# Patient Record
Sex: Male | Born: 1990 | Race: White | Hispanic: No | Marital: Single | Smoking: Never smoker
Health system: Southern US, Community
[De-identification: ages and names within clinical notes are randomized; demographics above are authoritative.]

## PROBLEM LIST (undated history)

## (undated) DIAGNOSIS — R079 Chest pain, unspecified: Secondary | ICD-10-CM

## (undated) HISTORY — PX: COLONOSCOPY: SHX174

## (undated) HISTORY — PX: CIRCUMCISION: SHX1350

## (undated) HISTORY — DX: Chest pain, unspecified: R07.9

---
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
## 2011-11-29 ENCOUNTER — Inpatient Hospital Stay: Payer: Self-pay | Admitting: Psychiatry

## 2011-11-29 LAB — COMPREHENSIVE METABOLIC PANEL
Albumin: 4.6 g/dL (ref 3.8–5.6)
Alkaline Phosphatase: 109 U/L (ref 98–317)
BUN: 12 mg/dL (ref 7–18)
Bilirubin,Total: 0.3 mg/dL (ref 0.2–1.0)
Calcium, Total: 8.7 mg/dL — ABNORMAL LOW (ref 9.0–10.7)
Co2: 19 mmol/L — ABNORMAL LOW (ref 21–32)
Creatinine: 1.09 mg/dL (ref 0.60–1.30)
EGFR (African American): >60
Osmolality: 287 (ref 275–301)
Potassium: 2.4 mmol/L — CL (ref 3.5–5.1)
SGOT(AST): 24 U/L (ref 10–41)
SGPT (ALT): 33 U/L
Total Protein: 8.1 g/dL (ref 6.4–8.6)

## 2011-11-29 LAB — URINALYSIS, COMPLETE
Bacteria: NONE SEEN
Blood: NEGATIVE
Leukocyte Esterase: NEGATIVE
RBC,UR: NONE SEEN /HPF (ref 0–5)
WBC UR: 2 /HPF (ref 0–5)

## 2011-11-29 LAB — CBC
HCT: 43.2 % (ref 40.0–52.0)
HGB: 14.5 g/dL (ref 13.0–18.0)
MCHC: 33.5 g/dL (ref 32.0–36.0)
MCV: 88 fL (ref 80–100)
Platelet: 204 10*3/uL (ref 150–440)
RBC: 4.91 10*6/uL (ref 4.40–5.90)
RDW: 12.8 % (ref 11.5–14.5)
WBC: 14.7 10*3/uL — ABNORMAL HIGH (ref 3.8–10.6)

## 2011-11-29 LAB — DRUG SCREEN, URINE
Cannabinoid 50 Ng, Ur ~~LOC~~: NEGATIVE (ref ?–50)
Cocaine Metabolite,Ur ~~LOC~~: NEGATIVE (ref ?–300)
Methadone, Ur Screen: NEGATIVE (ref ?–300)
Phencyclidine (PCP) Ur S: NEGATIVE (ref ?–25)
Tricyclic, Ur Screen: NEGATIVE (ref ?–1000)

## 2011-11-29 LAB — ETHANOL: Ethanol %: <0.003 % (ref 0.000–0.080)

## 2011-11-29 LAB — TSH: Thyroid Stimulating Horm: 1.65 u[IU]/mL

## 2011-11-30 LAB — BEHAVIORAL MEDICINE 1 PANEL
Alkaline Phosphatase: 82 U/L — ABNORMAL LOW (ref 98–317)
Anion Gap: 9 (ref 7–16)
BUN: 8 mg/dL (ref 7–18)
Basophil #: 0 10*3/uL (ref 0.0–0.1)
Basophil %: 0.3 %
Bilirubin,Total: 0.4 mg/dL (ref 0.2–1.0)
Chloride: 106 mmol/L (ref 98–107)
Co2: 26 mmol/L (ref 21–32)
Creatinine: 0.94 mg/dL (ref 0.60–1.30)
Eosinophil #: 0.1 10*3/uL (ref 0.0–0.7)
Glucose: 83 mg/dL (ref 65–99)
HCT: 41.2 % (ref 40.0–52.0)
HGB: 13.6 g/dL (ref 13.0–18.0)
Lymphocyte #: 3.2 10*3/uL (ref 1.0–3.6)
MCHC: 33 g/dL (ref 32.0–36.0)
Monocyte %: 6.1 %
Neutrophil #: 5 10*3/uL (ref 1.4–6.5)
Neutrophil %: 56.6 %
Osmolality: 279 (ref 275–301)
Platelet: 209 10*3/uL (ref 150–440)
Potassium: 3.7 mmol/L (ref 3.5–5.1)
RDW: 13.7 % (ref 11.5–14.5)
SGOT(AST): 16 U/L (ref 10–41)
SGPT (ALT): 30 U/L
Sodium: 141 mmol/L (ref 136–145)
Thyroid Stimulating Horm: 1.3 u[IU]/mL
Total Protein: 7.1 g/dL (ref 6.4–8.6)
WBC: 8.8 10*3/uL (ref 3.8–10.6)

## 2014-12-27 NOTE — Discharge Summary (Signed)
PATIENT NAME:  Cole Chavez, Cole G MR#:  [id number] DATE OF BIRTH:  1991/05/03  DATE OF ADMISSION:  11/29/2011 DATE OF DISCHARGE:  12/02/2011  HISTORY OF PRESENT ILLNESS: Cole Chavez was admitted to the inpatient behavioral unit after presenting status post overdose on trazodone and NyQuil. He had obtained the trazodone from a neighbor. His acute stress was a break-up with a girlfriend. He had some grief regarding his grandfather passing away a year prior to admission. He was not showing any auditory hallucinations or delusions. He was not disoriented. His memory function was intact. He also is socially appropriate.   LABORATORY, DIAGNOSTIC AND RADIOLOGICAL DATA: None.   HOSPITAL COURSE: Cole Chavez was admitted to the inpatient behavioral health unit and underwent milieu and group psychotherapy. He progressed well and immediately began to express regret about taking the overdose. He expressed constructive future goals and interests. He consistently shared that he was resolved about the break-up with his girlfriend.   CONDITION ON DISCHARGE: By 12/02/2011, Cole Chavez is alert. His energy is normal. His concentration is normal. His interests are also normal. He has constructive future goals. He has no thoughts of harming himself or others. He is behaving well in the milieu with normal social activity.   MENTAL STATUS EXAM UPON DISCHARGE: Cole Chavez is alert. Eye contact good. Concentration normal. Orientation to all spheres intact. Memory intact to immediate, recent, and remote. Fund of knowledge, intelligence, and use of language normal. Speech involves normal rate and prosody without dysarthria. Thought process logical, coherent, and goal directed. No looseness of associations. Thought content: No thoughts of harming himself. No thoughts of harming others. No delusions or hallucinations. Affect broad and appropriate. Mood within normal limits. Insight normal. Judgment intact.   Cole Chavez is  not at risk to harm himself or others. He agrees to call emergency services immediately for any thoughts of harming himself, thoughts of harming others, or distress.   DISCHARGE DIAGNOSES:  AXIS I: Adjustment disorder with disturbance of emotions and conduct, resolved.   AXIS II: Deferred.   AXIS III: Status post trazodone and NyQuil overdose.   AXIS IV: Primary support group.   AXIS V: 55.   DIET: Regular.   ACTIVITY: Routine.   FOLLOW UP: [facility] 12:30 p.m. for enhancement of coping skills and stress management.  ____________________________ Adelene AmasJames S. Neeko Pharo, MD jsw:cms D: 12/03/2011 11:08:29 ET T: 12/04/2011 13:24:10 ET JOB#: [id number]  cc: Adelene AmasJames S. Dontee Jaso, MD, <Dictator> Lester CarolinaJAMES S Remmi Armenteros MD ELECTRONICALLY SIGNED 12/04/2011 81:1920:02

## 2014-12-27 NOTE — H&P (Signed)
PATIENT NAME:  Cole Chavez, [name]:  [id number] DATE OF BIRTH:  Jan 08, 1991  DATE OF ADMISSION:  11/29/2011  CHIEF COMPLAINT AND IDENTIFYING DATA: Mr. Cole Chavez Predmore is a 24 year old male presenting to the Emergency Department on 11/29/2011 stating that he was overwhelmed after his girlfriend had broken up with him. He took an overdose of trazodone as well as NyQuil.   HISTORY OF PRESENT ILLNESS: Mr. Cole Chavez is stating that the trazodone taken was not his. He does not know of any past history of psychotropic medication.   He did have some appropriate regret about taking the overdose. He cited stresses with a girlfriend breakup as well as working and going to school at the same time. He also has been grieving his grandfather passing away a year ago.   He has not been having any auditory hallucinations or other hallucinations. He has no delusions. He is oriented to all spheres. He is socially appropriate. He only felt overwhelmed for one day.   PAST PSYCHIATRIC HISTORY: He denies any history of periods of elevated mood, racing thoughts, decreased need for energy, or increased goal-directed activity. He denies any history of periods of depressed mood, poor concentration, anhedonia. He has no history of hallucinations or delusions. He denies any history of treatment with psychotropic medication.   FAMILY PSYCHIATRIC HISTORY: None known.   SOCIAL HISTORY: Please see the above. Mr. Cole Chavez denies any alcohol or illegal drug use. He lives with a friend. He states that he lives in a supportive environment. He has no history of being arrested or incarcerated.   ALLERGIES: No known drug allergies.   PAST MEDICAL HISTORY: 1. Status post overdose with trazodone and NyQuil.  2. Usual childhood illnesses.   LABORATORY, DIAGNOSTIC AND RADIOLOGICAL DATA: SGPT and SGOT normal. TSH normal. Albumin, total protein normal. BUN, creatinine normal. CBC unremarkable. Urinalysis unremarkable. Tylenol negative.  Urine drug screen positive for amphetamines and MDMA. TSH normal. Salicylates negative. Ethanol negative.   REVIEW OF SYSTEMS: Constitutional, HEENT, mouth, neurologic, psychiatric, cardiovascular, respiratory, gastrointestinal, genitourinary, skin, musculoskeletal, hematologic, lymphatic, endocrine, metabolic all unremarkable.   PHYSICAL EXAMINATION:  VITAL SIGNS: Temperature 98, pulse 98, respiratory rate 18, blood pressure 140/72.   GENERAL APPEARANCE: Mr. Cole Chavez Kneip is a young man looking his chronologic age well developed, well nourished lying in a supine position in his hospital bed with no abnormal involuntary movements. He has no cachexia. His muscle tone is normal. He is groomed well. His hygiene is normal.   HEENT: Normocephalic, atraumatic. Pupils equally round, reactive to light and accommodation. Oropharynx clear without erythema.   SKIN: Normal turgor. No rashes.   EXTREMITIES: No cyanosis, clubbing, or edema.   RESPIRATORY: Clear to auscultation. No wheezing, rhonchi, or crackles.   CARDIOVASCULAR: Regular rate and rhythm. No murmurs, rubs or gallops.   ABDOMEN: Bowel sounds positive, nontender.   GENITOURINARY: Deferred.   NEUROLOGIC: Cranial nerves II through XII intact. Sensory intact throughout to light touch. Motor 5/5 strength throughout. Deep tendon reflexes normal strength and symmetry throughout. No Babinski. Coordination intact bilaterally finger-to-nose.   MENTAL STATUS EXAMINATION: Mr. Cole Chavez is alert. His eye contact is good. His concentration is normal. He is oriented to all spheres. Memory is intact to immediate, recent, and remote. Fund of knowledge, intelligence, and use of language normal. Speech involves normal rate and prosody without dysarthria. Thought process is logical, coherent, and goal directed. No looseness of associations or tangents. Thought content: No thoughts of harming himself or others. No delusions or hallucinations. He does  have  appropriate level of regret about the overdose. Affect is mildly flat. Mood is normal. Insight is intact. Judgment is questionable.   ASSESSMENT:  AXIS I:  1. Adjustment disorder with mixed disturbance of emotions and conduct.  2. Rule out polysubstance abuse.   AXIS II: Deferred.   AXIS III: Status post overdose of trazodone and NyQuil.   AXIS IV: Primary support group, academic, occupational.   AXIS V: 45.   Cole Chavez is recovering from his risk of self harm. He has appropriate regret regarding the activity.   PLAN: Will admit Cole Chavez is to the inpatient behavioral health unit for further evaluation and treatment and in order to confirm that he continues to improve his emotions, behavior, and cognition.   Will monitor for any need for psychotropic medication.   Mr. Schlabach is a candidate for outpatient psychotherapy involving coping skills, improvement as well as ego support. He may require some substance abuse treatment as well if the urine drug screen does not involve errors.  ____________________________ Adelene Amas. Yamel Bale, MD jsw:cms D: 12/01/2011 12:05:20 ET T: 12/01/2011 15:15:31 ET JOB#: [id number]  cc: Adelene Amas. Neeley Sedivy, MD, <Dictator> Lester Towanda MD ELECTRONICALLY SIGNED 12/01/2011 23:20

## 2020-08-08 ENCOUNTER — Emergency Department
Admission: EM | Admit: 2020-08-08 | Discharge: 2020-08-08 | Disposition: A | Discharge status: Home / Self Care | Payer: BC Managed Care – PPO | Attending: Emergency Medicine | Admitting: Emergency Medicine

## 2020-08-08 ENCOUNTER — Other Ambulatory Visit: Payer: Self-pay

## 2020-08-08 ENCOUNTER — Encounter: Payer: Self-pay | Admitting: Emergency Medicine

## 2020-08-08 ENCOUNTER — Emergency Department: Payer: BC Managed Care – PPO

## 2020-08-08 DIAGNOSIS — R070 Pain in throat: Secondary | ICD-10-CM | POA: Insufficient documentation

## 2020-08-08 DIAGNOSIS — J029 Acute pharyngitis, unspecified: Secondary | ICD-10-CM

## 2020-08-08 DIAGNOSIS — Z03821 Encounter for observation for suspected ingested foreign body ruled out: Secondary | ICD-10-CM

## 2020-08-08 DIAGNOSIS — R1010 Upper abdominal pain, unspecified: Secondary | ICD-10-CM | POA: Insufficient documentation

## 2020-08-08 DIAGNOSIS — T199XXA Foreign body in genitourinary tract, part unspecified, initial encounter: Secondary | ICD-10-CM

## 2020-08-08 MED ORDER — LIDOCAINE VISCOUS HCL 2 % MT SOLN
15.0000 mL | Freq: Once | OROMUCOSAL | Status: AC
  Administered 2020-08-08: 15 mL via OROMUCOSAL
  Filled 2020-08-08: qty 15

## 2020-08-08 NOTE — Discharge Instructions (Addendum)
Please return to the emergency department if you have any acute worsening including abdominal pain, bloody stools or any other new symptoms.

## 2020-08-08 NOTE — ED Provider Notes (Signed)
[facility] Emergency Department Provider Note  ____________________________________________   None    (approximate)  I have reviewed the triage vital signs and the nursing notes.   HISTORY  Chief Complaint Foreign Body  HPI Cole Chavez is a 29 y.o. male who reports to the emergency department for evaluation of ingested foreign body.  The patient states that he went to eat some stoop that he had recently gotten out of the microwave when he swallowed what he believes to be 2 nails.  He states that he regurgitated 1 nail up and it was approximately half to 1 inch long.  He believes but is unsure that he may have swallowed a second.  He thinks the way the nail got in the stew was he saw his catch up up on a ledge that was nearby.  On the top without ledge were a few nails as was also a metallic broken Christmas tree ornament that the cat had also broken.  He is unsure if this second item could have been metallic coated glass versus nail.  He states that someone with him perform the Heimlich to try to get up any thing remaining but nothing else came up.  At this time, his primary complaint is sore throat.  He has had some intermittent upper abdominal pain, but states that this was just after the Heimlich maneuver and he does not have any at this time.  He denies any pain in his chest, shortness of breath.         History reviewed. No pertinent past medical history.  There are no problems to display for this patient.   History reviewed. No pertinent surgical history.  Prior to Admission medications   Not on File    Allergies Patient has no known allergies.  No family history on file.  Social History Social History   Tobacco Use  . Smoking status: Never Smoker  . Smokeless tobacco: Never Used  Vaping Use  . Vaping Use: Never used  Substance Use Topics  . Alcohol use: Not on file  . Drug use: Not on file    Review of  Systems Constitutional: No fever/chills Eyes: No visual changes. ENT: + sore throat. Cardiovascular: Denies chest pain. Respiratory: Denies shortness of breath. Gastrointestinal: + abdominal pain.  No nausea, no vomiting.  No diarrhea.  No constipation. Genitourinary: Negative for dysuria. Musculoskeletal: Negative for back pain. Skin: Negative for rash. Neurological: Negative for headaches, focal weakness or numbness.   ____________________________________________   PHYSICAL EXAM:  VITAL SIGNS: ED Triage Vitals  Enc Vitals Group     BP 08/08/20 1420 124/77     Pulse Rate 08/08/20 1420 75     Resp 08/08/20 1420 18     Temp 08/08/20 1420 97.6 F (36.4 C)     Temp Source 08/08/20 1420 Oral     SpO2 08/08/20 1420 100 %     Weight 08/08/20 1417 170 lb (77.1 kg)     Height 08/08/20 1417 5\' 9"  (1.753 m)     Head Circumference --      Peak Flow --      Pain Score 08/08/20 1416 6     Pain Loc --      Pain Edu? --      Excl. in [place]? --     Constitutional: Alert and oriented. Well appearing and in no acute distress.  Managing own secretions well. Eyes: Conjunctivae are normal. PERRL. EOMI. Head: Atraumatic. Nose: No congestion/rhinnorhea.  Mouth/Throat: Mucous membranes are moist.  Oropharynx mildly erythematous. Neck: No stridor.   Cardiovascular: Normal rate, regular rhythm. Grossly normal heart sounds.  Good peripheral circulation. Respiratory: Normal respiratory effort.  No retractions. Lungs CTAB. Gastrointestinal: Soft and nontender. No distention. No abdominal bruits. No CVA tenderness. Musculoskeletal: No lower extremity tenderness nor edema.  No joint effusions. Neurologic:  Normal speech and language. No gross focal neurologic deficits are appreciated. No gait instability. Skin:  Skin is warm, dry and intact. No rash noted. Psychiatric: Mood and affect are normal. Speech and behavior are normal.   ____________________________________________  RADIOLOGY I, Lucy Chris, personally viewed and evaluated these images (plain radiographs) as part of my medical decision making, as well as reviewing the written report by the radiologist.  ED provider interpretation: No radiopaque foreign body.  Official radiology report(s): DG Chest 1 View  Result Date: 08/08/2020 CLINICAL DATA:  Patient is afraid he swallowed a 3 inch nail. EXAM: CHEST  1 VIEW COMPARISON:  July 27, 2008 FINDINGS: The heart, hila, and mediastinum are normal. No pneumothorax. No nodules or masses. No focal infiltrates. No foreign bodies. IMPRESSION: No foreign bodies.  No acute abnormalities. Electronically Signed   By: Gerome Sam III M.D   On: 08/08/2020 14:58   DG Abd 1 View  Result Date: 08/08/2020 CLINICAL DATA:  Patient is afraid that he swallowed a 3 inch nail. EXAM: ABDOMEN - 1 VIEW COMPARISON:  None. FINDINGS: No foreign body identified.  Normal study. IMPRESSION: Normal study.  No foreign body identified. Electronically Signed   By: Gerome Sam III M.D   On: 08/08/2020 14:57    ____________________________________________   INITIAL IMPRESSION / ASSESSMENT AND PLAN / ED COURSE  As part of my medical decision making, I reviewed the following data within the electronic MEDICAL RECORD NUMBER Nursing notes reviewed and incorporated and Radiograph reviewed         Patient is a 29 year old male who presents emergency department for evaluation of swallowing of possible foreign body.  He is not really sure what he may or may not have swallowed.  He does know that he regurgitated up on nail but is afraid that he might have swallowed 2 or that a piece of Christmas tree ornament may have also ended up in this 2 that he ate.  On physical exam, the patient does not have any active abdominal pain with palpation.  No radiopaque foreign bodies were identified.  Given that the nail would have been metallic and that the broken ornament was metallic coated I suspect that either of these  would have likely shown up on x-ray where they still present.  Treated the patient with viscous lidocaine which he states greatly improved his sore throat related to the incident.  We will discharge the patient with strict return precautions to return if he has any acute abdominal pain, bloody stool, or other change in symptoms.  The patient is amenable with this plan is stable at this time for outpatient therapy.      ____________________________________________   FINAL CLINICAL IMPRESSION(S) / ED DIAGNOSES  Final diagnoses:  Throat soreness  Encounter for observation for suspected ingested foreign body ruled out     ED Discharge Orders    None      *Please note:  Cole Chavez was evaluated in Emergency Department on 08/08/2020 for the symptoms described in the history of present illness. He was evaluated in the context of the global COVID-19 pandemic, which necessitated consideration  that the patient might be at risk for infection with the SARS-CoV-2 virus that causes COVID-19. Institutional protocols and algorithms that pertain to the evaluation of patients at risk for COVID-19 are in a state of rapid change based on information released by regulatory bodies including the CDC and federal and state organizations. These policies and algorithms were followed during the patient's care in the ED.  Some ED evaluations and interventions may be delayed as a result of limited staffing during and the pandemic.*   Note:  This document was prepared using Dragon voice recognition software and may include unintentional dictation errors.    Lucy Chris, PA 08/08/20 2354    Phineas Semen, MD 08/16/20 [phone]

## 2020-08-08 NOTE — ED Triage Notes (Signed)
Pt arrived via [facility] with reports of possibly swallowing a nail. Pt was drinking soup and states he felt something in his mouth, states that he pulled out a nail, pt states he thinks he may have swallowed another nail. Pt states his cat was near the soup and there were nails nearby and thinks the cat knocked the nails into his bowl of soup.  Pt c/o sore throat at this time.

## 2021-04-14 IMAGING — CR DG ABDOMEN 1V
1 series · 1 of 1 positions shown · non-contrast
Comparison: None.

CLINICAL DATA: Patient is afraid that he swallowed a 3 inch nail.

EXAM:
ABDOMEN - 1 VIEW

[abdomen kub]
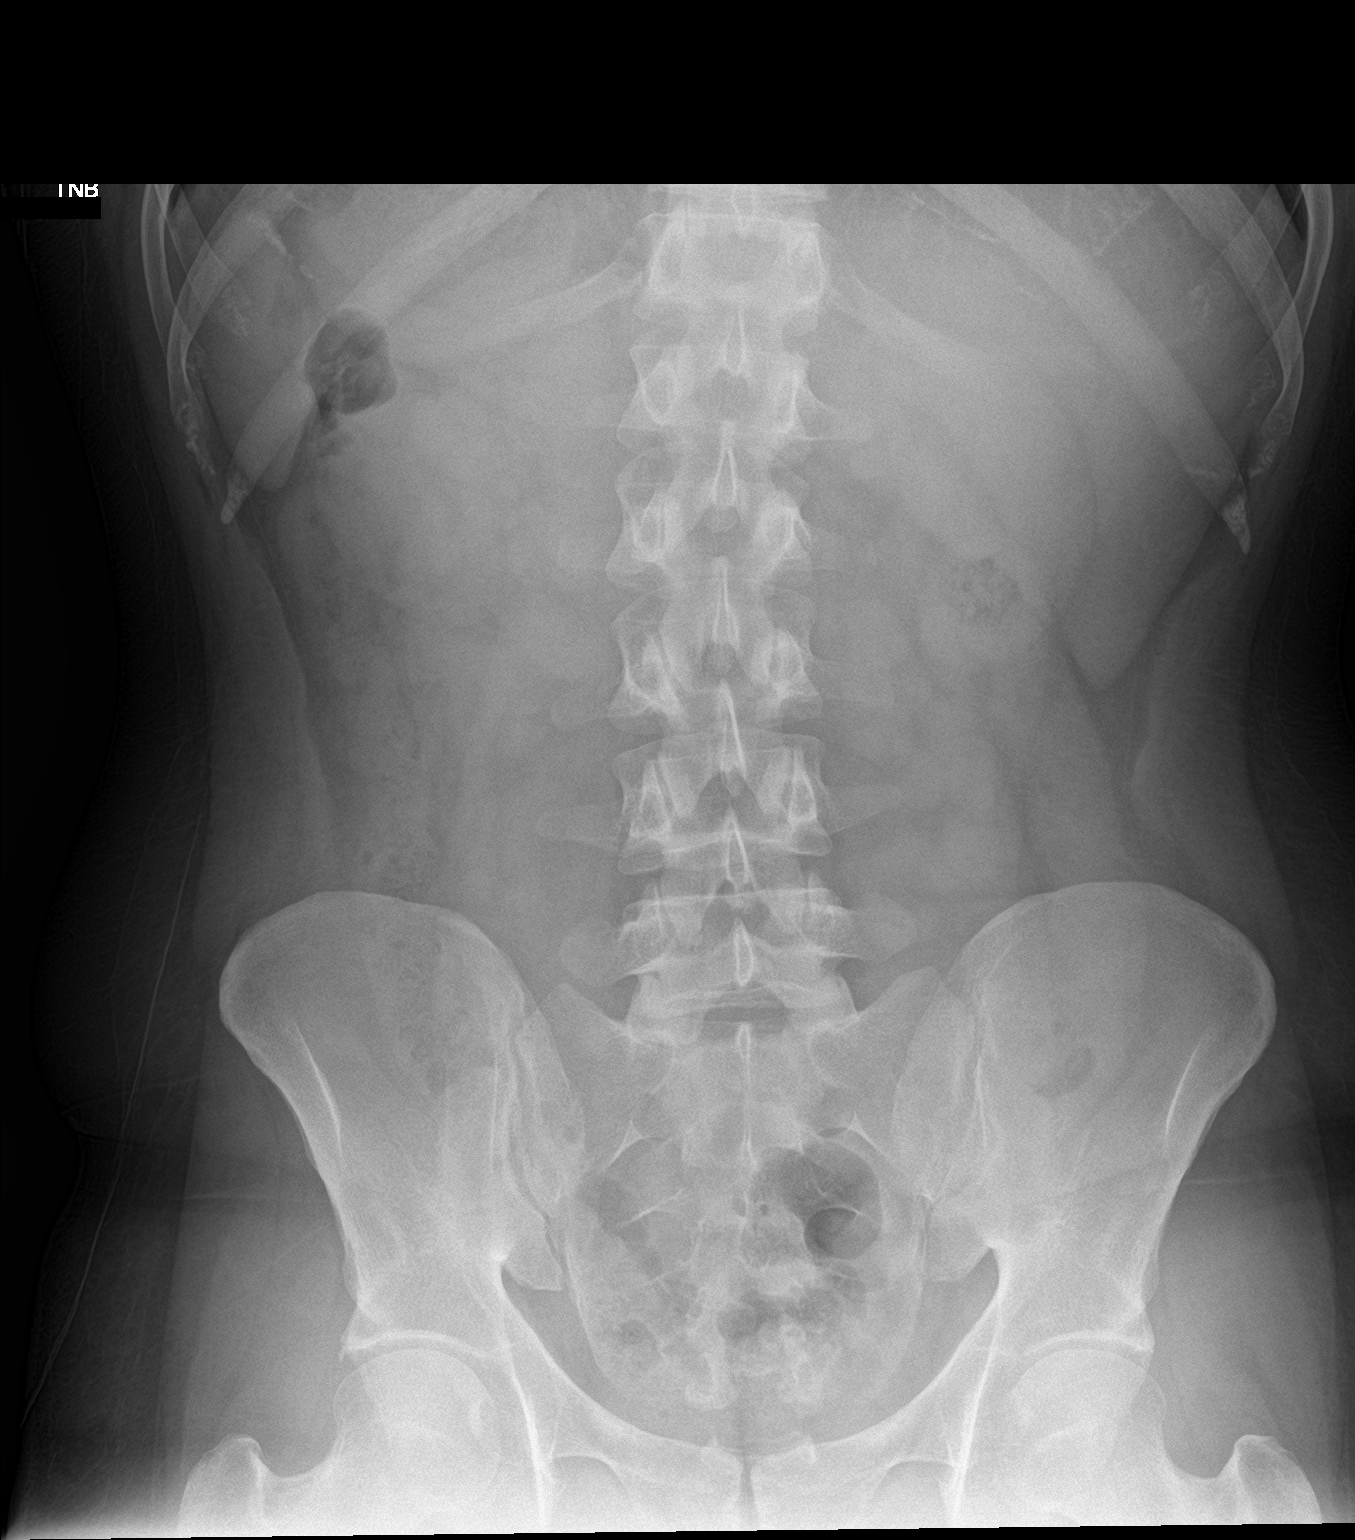

[1 of 1 positions shown; findings below may reference images not displayed]

FINDINGS: No foreign body identified.  Normal study.
IMPRESSION: Normal study.  No foreign body identified.

## 2022-06-27 ENCOUNTER — Emergency Department
Admission: EM | Admit: 2022-06-27 | Discharge: 2022-06-27 | Disposition: A | Discharge status: Home / Self Care | Payer: 59 | Attending: Emergency Medicine | Admitting: Emergency Medicine

## 2022-06-27 ENCOUNTER — Other Ambulatory Visit: Payer: Self-pay

## 2022-06-27 ENCOUNTER — Emergency Department: Payer: 59

## 2022-06-27 DIAGNOSIS — R0789 Other chest pain: Secondary | ICD-10-CM | POA: Diagnosis present

## 2022-06-27 LAB — CBC
HCT: 45.2 % (ref 39.0–52.0)
Hemoglobin: 15.5 g/dL (ref 13.0–17.0)
MCH: 30 pg (ref 26.0–34.0)
MCHC: 34.3 g/dL (ref 30.0–36.0)
MCV: 87.4 fL (ref 80.0–100.0)
Platelets: 256 10*3/uL (ref 150–400)
RBC: 5.17 MIL/uL (ref 4.22–5.81)
RDW: 12.5 % (ref 11.5–15.5)
WBC: 9.9 10*3/uL (ref 4.0–10.5)
nRBC: 0 % (ref 0.0–0.2)

## 2022-06-27 LAB — BASIC METABOLIC PANEL
Anion gap: 10 (ref 5–15)
BUN: 16 mg/dL (ref 6–20)
CO2: 23 mmol/L (ref 22–32)
Calcium: 9.6 mg/dL (ref 8.9–10.3)
Chloride: 106 mmol/L (ref 98–111)
Creatinine, Ser: 0.83 mg/dL (ref 0.61–1.24)
GFR, Estimated: >60 mL/min (ref >60)
Glucose, Bld: 96 mg/dL (ref 70–99)
Potassium: 3.8 mmol/L (ref 3.5–5.1)
Sodium: 139 mmol/L (ref 135–145)

## 2022-06-27 LAB — TROPONIN I (HIGH SENSITIVITY): Troponin I (High Sensitivity): <2 ng/L (ref <18)

## 2022-06-27 MED ORDER — KETOROLAC TROMETHAMINE 15 MG/ML IJ SOLN
15.0000 mg | Freq: Once | INTRAMUSCULAR | Status: AC
  Administered 2022-06-27: 15 mg via INTRAVENOUS
  Filled 2022-06-27: qty 1

## 2022-06-27 MED ORDER — NAPROXEN 500 MG PO TABS: 500.0000 mg | ORAL_TABLET | Freq: Two times a day (BID) | ORAL | 2 refills | Status: AC

## 2022-06-27 NOTE — ED Triage Notes (Addendum)
Pt comes wiht c/o chest tightness that started last night while eating dinner. Pt states sob. Pt states hx of panic attack months ago but feels this is more intense.  Pt also states dizziness, nausea and some numbness to face arms.

## 2022-06-27 NOTE — ED Provider Notes (Signed)
[facility] Provider Note    Event Date/Time   First MD Initiated Contact with Patient 06/27/22 [phone]     (approximate)   History   Chest Pain   HPI  Cole Chavez is a 31 y.o. male with no significant past medical history presents with complaints of chest tightness.  Patient reports that started yesterday evening while eating dinner.  Has continued today.  No history of coronary artery disease.  No fevers chills.  No pleurisy.  No cough.  No calf pain or swelling.  No recent travel.     Physical Exam   Triage Vital Signs: ED Triage Vitals  Enc Vitals Group     BP 06/27/22 0939 121/75     Pulse Rate 06/27/22 0939 69     Resp 06/27/22 0939 16     Temp 06/27/22 0939 98.3 F (36.8 C)     Temp src --      SpO2 06/27/22 0939 100 %     Weight --      Height --      Head Circumference --      Peak Flow --      Pain Score 06/27/22 0935 3     Pain Loc --      Pain Edu? --      Excl. in [place]? --     Most recent vital signs: Vitals:   06/27/22 0939 06/27/22 1015  BP: 121/75 105/75  Pulse: 69 75  Resp: 16 20  Temp: 98.3 F (36.8 C) 98.3 F (36.8 C)  SpO2: 100% 100%     General: Awake, no distress.  CV:  Good peripheral perfusion.  Regular rate and rhythm Resp:  Normal effort.  CTA bilaterally Abd:  No distention.  Other:  No calf swelling   ED Results / Procedures / Treatments   Labs (all labs ordered are listed, but only abnormal results are displayed) Labs Reviewed  BASIC METABOLIC PANEL  CBC  TROPONIN I (HIGH SENSITIVITY)  TROPONIN I (HIGH SENSITIVITY)     EKG  ED ECG REPORT I, Lavonia Drafts, the attending physician, personally viewed and interpreted this ECG.  Date: 06/27/2022  Rhythm: normal sinus rhythm QRS Axis: normal Intervals: normal ST/T Wave abnormalities: normal Narrative Interpretation: no evidence of acute ischemia    RADIOLOGY Chest x-ray viewed interpreted by me, no acute  abnormality    PROCEDURES:  Critical Care performed:   Procedures   MEDICATIONS ORDERED IN ED: Medications  ketorolac (TORADOL) 15 MG/ML injection 15 mg (15 mg Intravenous Given 06/27/22 1056)     IMPRESSION / MDM / ASSESSMENT AND PLAN / ED COURSE  I reviewed the triage vital signs and the nursing notes. Patient's presentation is most consistent with acute presentation with potential threat to life or bodily function.  Patient presents with chest pain as detailed above.  Differential includes ACS although EKG is quite reassuring, myocarditis, GERD, chest wall pain as he does have tenderness in the area, no rash.  Less likely pneumonia, pneumothorax  Lab work demonstrates normal high sensitive troponin, normal CBC, BMP  Patient treated with IV Toradol with improvement  Not consistent with myocarditis, ACS, no pneumonia pneumothorax, doubt GERD.  Most consistent with chest wall pain, will treat with NSAIDs, outpatient follow-up with cardiology arranged.  Return precautions discussed      FINAL CLINICAL IMPRESSION(S) / ED DIAGNOSES   Final diagnoses:  Atypical chest pain     Rx / DC Orders   ED  Discharge Orders          Ordered    Ambulatory referral to Cardiology       Comments: If you have not heard from the Cardiology office within the next 72 hours please call [phone].   06/27/22 1102    naproxen (NAPROSYN) 500 MG tablet  2 times daily with meals        06/27/22 1102             Note:  This document was prepared using Dragon voice recognition software and may include unintentional dictation errors.   Lavonia Drafts, MD 06/27/22 1147

## 2022-06-27 NOTE — ED Notes (Addendum)
Pt started having SOB and chest tightness and burning and jolts of pain and numbness in arms but mostly the L arm and face then today more chest pressure and tightness and tingling around fingertips and SOB also feeling dizzy and lightheaded.

## 2022-06-28 ENCOUNTER — Encounter: Payer: Self-pay | Admitting: Internal Medicine

## 2022-06-28 ENCOUNTER — Ambulatory Visit: Payer: 59 | Attending: Internal Medicine | Admitting: Internal Medicine

## 2022-06-28 VITALS — BP 120/72 | HR 74 | Ht 68.0 in | Wt 201.0 lb

## 2022-06-28 DIAGNOSIS — R079 Chest pain, unspecified: Secondary | ICD-10-CM | POA: Diagnosis not present

## 2022-06-28 MED ORDER — OMEPRAZOLE MAGNESIUM 20 MG PO TBEC: 20.0000 mg | DELAYED_RELEASE_TABLET | Freq: Every day | ORAL | Status: AC

## 2022-06-28 NOTE — Patient Instructions (Signed)
Medication Instructions:   Your physician has recommended you make the following change in your medication:    PICK UP your naproxen (NAPROSYN) 500 MG previously prescribed for you and take twice a day with meals.  2.   START taking Omperazole (Prilosec) 20 MG once a day. You can buy this over the counter.  *If you need a refill on your cardiac medications before your next appointment, please call your pharmacy*    Follow-Up: At [facility], you and your health needs are our priority.  As part of our continuing mission to provide you with exceptional heart care, we have created designated Provider Care Teams.  These Care Teams include your primary Cardiologist (physician) and Advanced Practice Providers (APPs -  Physician Assistants and Nurse Practitioners) who all work together to provide you with the care you need, when you need it.  We recommend signing up for the patient portal called "MyChart".  Sign up information is provided on this After Visit Summary.  MyChart is used to connect with patients for Virtual Visits (Telemedicine).  Patients are able to view lab/test results, encounter notes, upcoming appointments, etc.  Non-urgent messages can be sent to your provider as well.   To learn more about what you can do with MyChart, go to [email or web address].    Your next appointment:   1 month(s)  The format for your next appointment:   In Person  Provider:   You may see Nelva Bush, MD or one of the following Advanced Practice Providers on your designated Care Team:   Murray Hodgkins, NP Christell Faith, PA-C Cadence Kathlen Mody, PA-C Gerrie Nordmann, NP    Other Instructions   Important Information About Sugar

## 2022-06-28 NOTE — Progress Notes (Unsigned)
New Outpatient Visit Date: 06/28/2022  Referring Provider: Lavonia Drafts, MD [place],  [place] [id number]  Chief Complaint: Chest pain  HPI:  Cole Chavez is a 31 y.o. male who is being seen today for the evaluation of chest pain at the request of Dr. Corky Downs. He has no significant past medical history.  He presented to the [facility] ED yesterday, complaining of chest tightness that began the night before while eating dinner.  ED workup was unremarkable.  EKG showed normal sinus rhythm without abnormalities.  HS-TnI was <2 (checked once).  Cole Chavez reports that he began having burning and tightness across his chest shortly after finishing his dinner two nights ago.  This was associated diaphoresis and numbness of tingling involving the arms/hand and face.  He notes having had a "panic attack" a few months ago that felt similar albeit less severe.  He denies a history of heart disease.  He was diagnosed with "inflammation" of the stomach and bowels in the past, though he denies being told that he has IBD.  He was found to have tenderness of the chest wall in the ED, prompting him to receive ketorolac and a prescription for naproxen.  He does not recall any chest wall trauma or other precipitants for his episode.  He notes more stress over the last few months, as his mother and maternal grandmother have been dealing with heart disease and leukemia, respectively.  He has not had any palpitations, lightheadedness, edema, or orthopnea.  --------------------------------------------------------------------------------------------------  Cardiovascular History & Procedures: Cardiovascular Problems: Chest pain  Risk Factors: Male gender and family history  Cath/PCI: None  CV Surgery: None  EP Procedures and Devices: None  Non-Invasive Evaluation(s): None  Recent CV Pertinent Labs: Lab Results  Component Value Date   K 3.8 06/27/2022   K 3.7 11/30/2011   BUN 16  06/27/2022   BUN 8 11/30/2011   CREATININE 0.83 06/27/2022   CREATININE 0.94 11/30/2011    --------------------------------------------------------------------------------------------------  History reviewed. No pertinent past medical history.  Past Surgical History:  Procedure Laterality Date   CIRCUMCISION     COLONOSCOPY      Current Meds  Medication Sig   naproxen (NAPROSYN) 500 MG tablet Take 1 tablet (500 mg total) by mouth 2 (two) times daily with a meal.   omeprazole (PRILOSEC OTC) 20 MG tablet Take 1 tablet (20 mg total) by mouth daily.    Allergies: Patient has no known allergies.  Social History   Tobacco Use   Smoking status: Never   Smokeless tobacco: Never  Vaping Use   Vaping Use: Never used  Substance Use Topics   Alcohol use: Not Currently    Comment: 2 drinks per week; none for the last several weeks   Drug use: Not Currently    Types: Marijuana    Family History  Problem Relation Age of Onset   Heart failure Mother 3   Heart disease Mother    Heart attack Mother 45   Leukemia Maternal Grandmother     Review of Systems: A 12-system review of systems was performed and was negative except as noted in the HPI.  --------------------------------------------------------------------------------------------------  Physical Exam: BP 120/72 (BP Location: Right Arm, Patient Position: Sitting, Cuff Size: Large)   Pulse 74   Ht 5\' 8"  (1.727 m)   Wt 201 lb (91.2 kg)   SpO2 99%   BMI 30.56 kg/m   General:  NAD. HEENT: No conjunctival pallor or scleral icterus. Neck: Supple without lymphadenopathy,  thyromegaly, JVD, or HJR. No carotid bruit. Lungs: Normal work of breathing. Clear to auscultation bilaterally without wheezes or crackles. Heart: Regular rate and rhythm without murmurs, rubs, or gallops. Non-displaced PMI.  Left anterior chest wall tender to palpation. Abd: Bowel sounds present. Soft, NT/ND without hepatosplenomegaly Ext: No lower  extremity edema. Radial, PT, and DP pulses are 2+ bilaterally Skin: Warm and dry without rash. Neuro: CNIII-XII intact. Strength and fine-touch sensation intact in upper and lower extremities bilaterally. Psych: Normal mood and affect.  EKG:  Normal sinus rhythm with sinus arrhythmia.  No significant change from prior tracing on 06/27/2022.  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  ED workup and exam in the office today are unremarkable other than left chest wall tenderness.  Only significant risk factor is his family history.  Lipid panel through his PCP earlier this year was normal.  I have encouraged him to take naproxen prescribed by the ED for 1 week.  Given history of gastric/bowel "inflammation" and planned high-dose NSAID use, I have recommended that he use omeprazole 20 mg daily as well.  We will defer additional testing at this time.  However, if pain does not improve, we would need to consider noninvasive ischemia testing at follow-up.  Follow-up: Return to clinic in 1 month.  Nelva Bush, MD 06/29/2022 11:19 AM

## 2022-06-29 ENCOUNTER — Encounter: Payer: Self-pay | Admitting: Internal Medicine

## 2022-06-29 DIAGNOSIS — R079 Chest pain, unspecified: Secondary | ICD-10-CM | POA: Insufficient documentation

## 2022-08-01 ENCOUNTER — Ambulatory Visit: Payer: 59 | Admitting: Cardiology
